# Patient Record
Sex: Female | Born: 1980 | Race: White | Hispanic: No | Marital: Married | Smoking: Never smoker
Health system: Southern US, Community
[De-identification: ages and names within clinical notes are randomized; demographics above are authoritative.]

## PROBLEM LIST (undated history)

## (undated) DIAGNOSIS — N200 Calculus of kidney: Secondary | ICD-10-CM

## (undated) DIAGNOSIS — E538 Deficiency of other specified B group vitamins: Secondary | ICD-10-CM

## (undated) DIAGNOSIS — L309 Dermatitis, unspecified: Secondary | ICD-10-CM

## (undated) HISTORY — PX: WISDOM TOOTH EXTRACTION: SHX21

## (undated) HISTORY — DX: Deficiency of other specified B group vitamins: E53.8

## (undated) HISTORY — DX: Dermatitis, unspecified: L30.9

## (undated) HISTORY — DX: Calculus of kidney: N20.0

---

## 2006-05-11 ENCOUNTER — Emergency Department: Payer: Self-pay | Admitting: Emergency Medicine

## 2008-07-17 ENCOUNTER — Inpatient Hospital Stay: Payer: Self-pay

## 2008-07-31 ENCOUNTER — Ambulatory Visit: Payer: Self-pay | Admitting: Pediatrics

## 2010-10-10 ENCOUNTER — Inpatient Hospital Stay: Payer: Self-pay | Admitting: Obstetrics and Gynecology

## 2013-04-16 ENCOUNTER — Encounter: Payer: Self-pay | Admitting: Maternal & Fetal Medicine

## 2013-04-30 ENCOUNTER — Observation Stay: Payer: Self-pay | Admitting: Obstetrics & Gynecology

## 2013-05-02 ENCOUNTER — Inpatient Hospital Stay: Payer: Self-pay | Admitting: Certified Nurse Midwife

## 2013-05-02 LAB — CBC WITH DIFFERENTIAL/PLATELET
Basophil #: 0.1 10*3/uL (ref 0.0–0.1)
Basophil %: 0.6 %
Eosinophil #: 0.2 10*3/uL (ref 0.0–0.7)
Eosinophil %: 1.4 %
Lymphocyte %: 18.6 %
MCH: 31.9 pg (ref 26.0–34.0)
Monocyte #: 0.7 x10 3/mm (ref 0.2–0.9)
Monocyte %: 6.2 %
RBC: 3.89 10*6/uL (ref 3.80–5.20)
WBC: 12.1 10*3/uL — ABNORMAL HIGH (ref 3.6–11.0)

## 2013-05-03 LAB — HEMATOCRIT: HCT: 31.1 % — ABNORMAL LOW (ref 35.0–47.0)

## 2014-10-08 ENCOUNTER — Ambulatory Visit: Payer: Self-pay | Admitting: Nurse Practitioner

## 2015-01-04 NOTE — H&P (Signed)
L&D Evaluation:  History:  HPI 34 yo G3P0202 previous pre43term deliveries presenting at 2055w2d with SROM at 0300, contractions.  No VB, no LOF   Presents with contractions   Patient's Medical History low blood pressure   Patient's Surgical History wisdom teeth   Medications Pre Natal Vitamins   Allergies NKDA   Social History none  teacher   Family History Non-Contributory   ROS:  ROS All systems were reviewed.  HEENT, CNS, GI, GU, Respiratory, CV, Renal and Musculoskeletal systems were found to be normal.   Exam:  Vital Signs stable   Urine Protein not completed   General no apparent distress   Mental Status clear   Abdomen gravid, non-tender   Estimated Fetal Weight Average for gestational age   Fetal Position vertex   Back no CVAT   Pelvic no external lesions, 6cm   Mebranes Ruptured   Description clear   FHT normal rate with no decels, reactive   Ucx regular   Skin dry   Impression:  Impression Precipitious vaginal delivery   Plan:  Comments admit for postpartum care   Electronic Signatures: Lorrene ReidStaebler, Mukhtar Shams M (MD)  (Signed 06-Sep-14 04:51)  Authored: L&D Evaluation   Last Updated: 06-Sep-14 04:51 by Lorrene ReidStaebler, [place] Severs M (MD)

## 2015-01-04 NOTE — H&P (Signed)
L&D Evaluation:  History Expanded:  HPI [age] yo G3P0202 previous preterm deliveries. Now 40 weeks. Non-Stress Test today.  AFI normal today.   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  HEENT, CNS, GI, GU, Respiratory, CV, Renal and Musculoskeletal systems were found to be normal.   Exam:  Vital Signs stable   Urine Protein not completed   General no apparent distress   Mental Status clear   Abdomen gravid, non-tender   Estimated Fetal Weight Average for gestational age   Fetal Position vertex   Back no CVAT   Pelvic no external lesions, 4cm by Dr Bonney AidStaebler   FHT normal rate with no decels   Fetal Heart Rate 140   Ucx absent   Skin dry   Impression:  Impression Post Dates, Non-Stress Test   Plan:  Plan EFM/NST, antibiotics for GBBS prophylaxis, fluids   Comments Non-Stress Test REACTIVE Fetal Movement Counts discussed.   Follow Up Appointment already scheduled. in 6 weeks   Electronic Signatures: Letitia LibraHarris, Sativa Gelles Paul (MD)  (Signed 04-Sep-14 18:02)  Authored: L&D Evaluation   Last Updated: 04-Sep-14 18:02 by Letitia LibraHarris, Avishai Reihl Paul (MD)

## 2016-10-10 IMAGING — US US GALLBLADDER-BILIARY (RUQ)
1 series · 14 of 25 positions shown · non-contrast
Comparison: None.

CLINICAL DATA: Chronic right upper quadrant abdominal pain

EXAM:
US ABDOMEN LIMITED - RIGHT UPPER QUADRANT

[Series 1: us gallbladder-biliary (ruq) · 0.31mm/px · 14 of 50 slices shown]
[im 1/50]
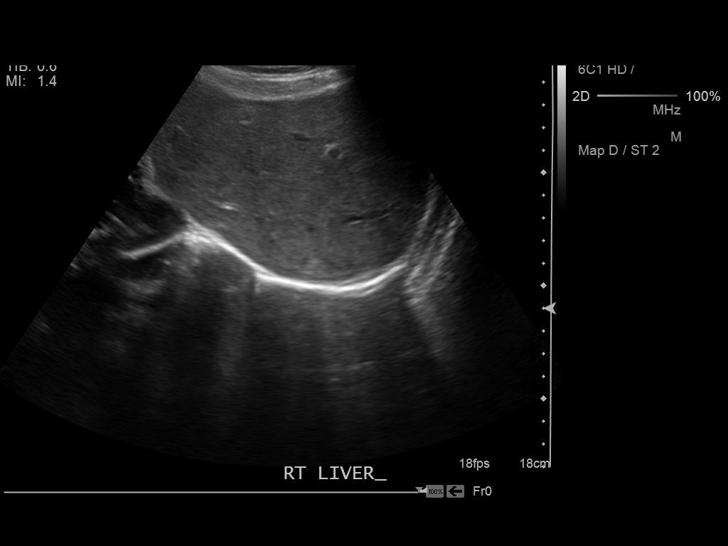
[im 5/50]
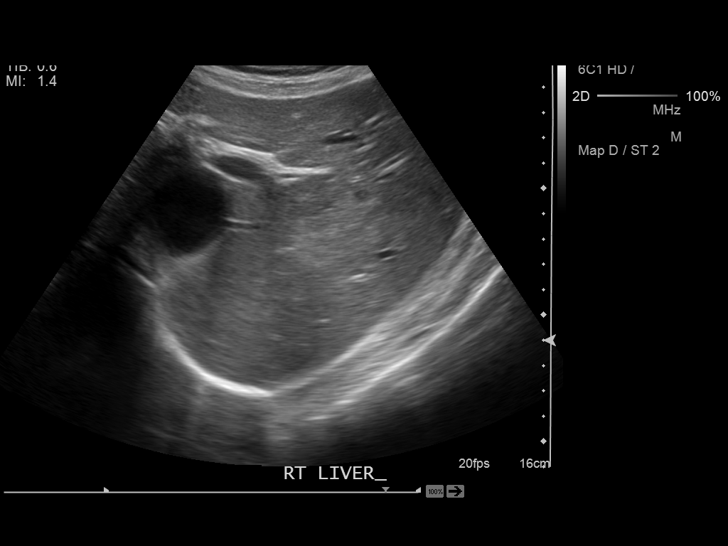
[im 9/50]
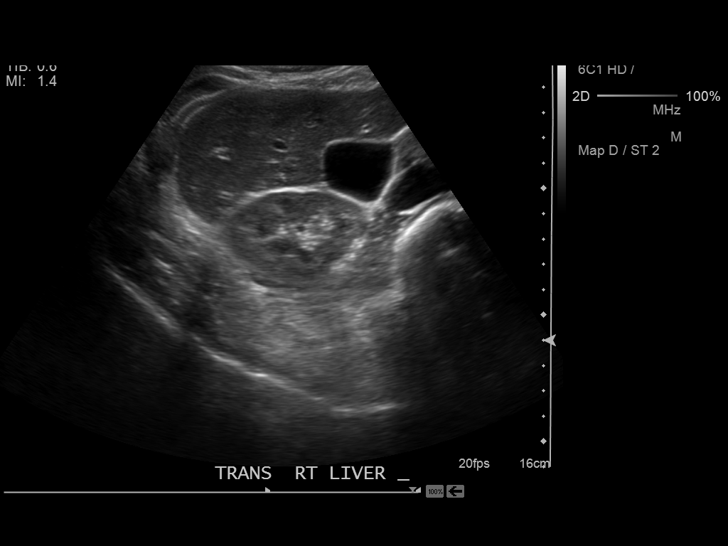
[im 13/50]
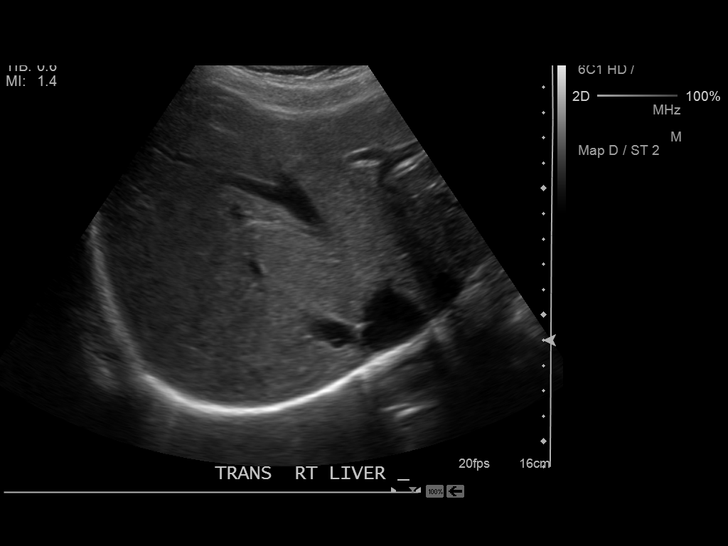
[im 17/50]
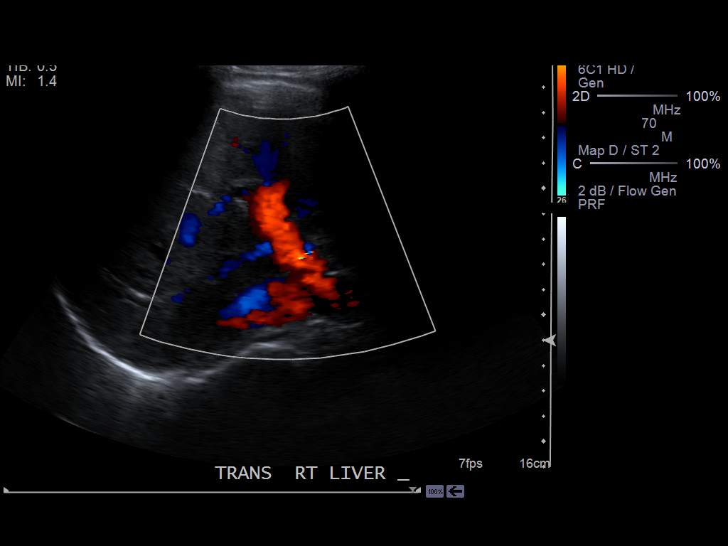
[im 19/50]
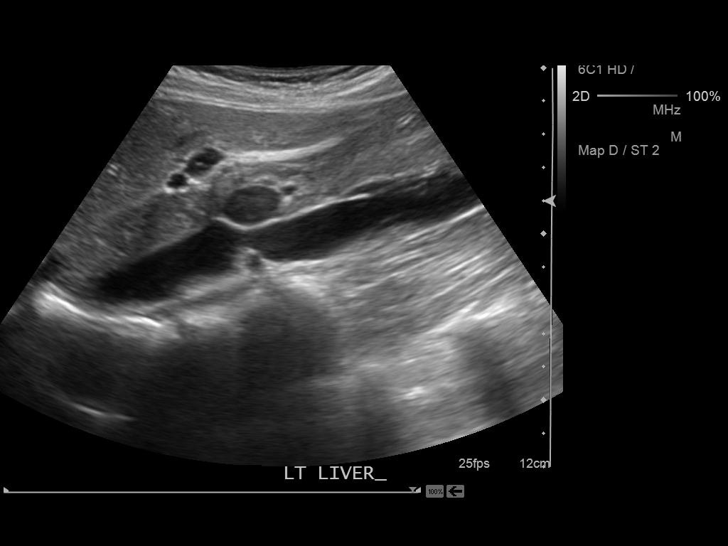
[im 23/50]
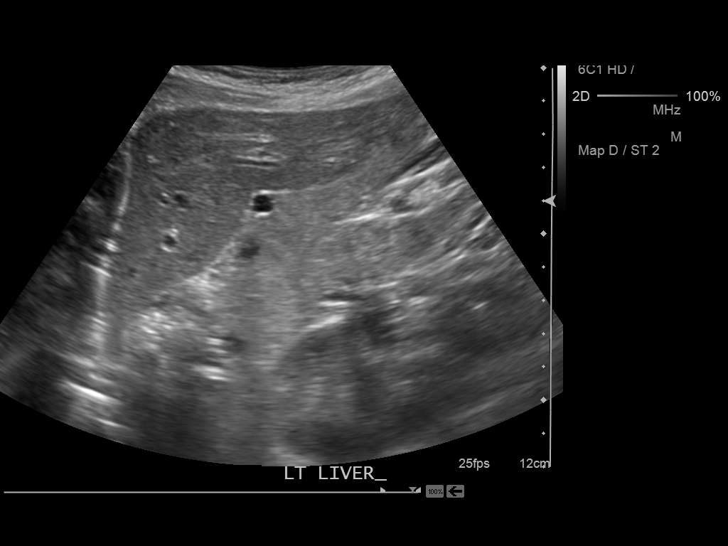
[im 27/50]
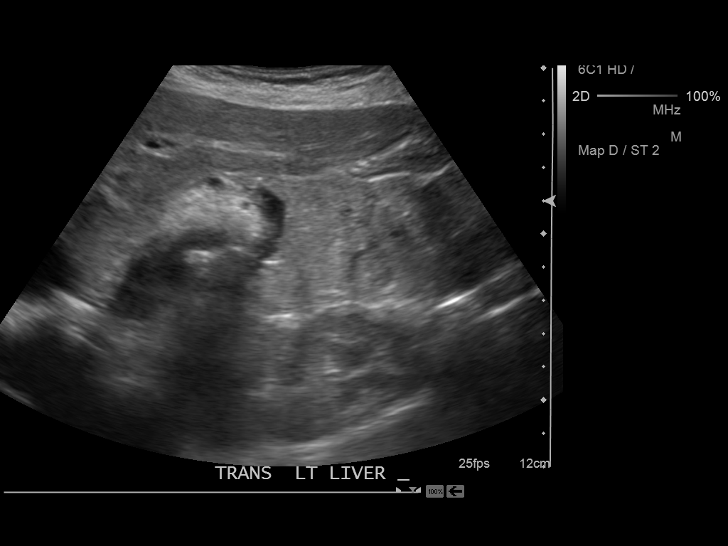
[im 31/50]
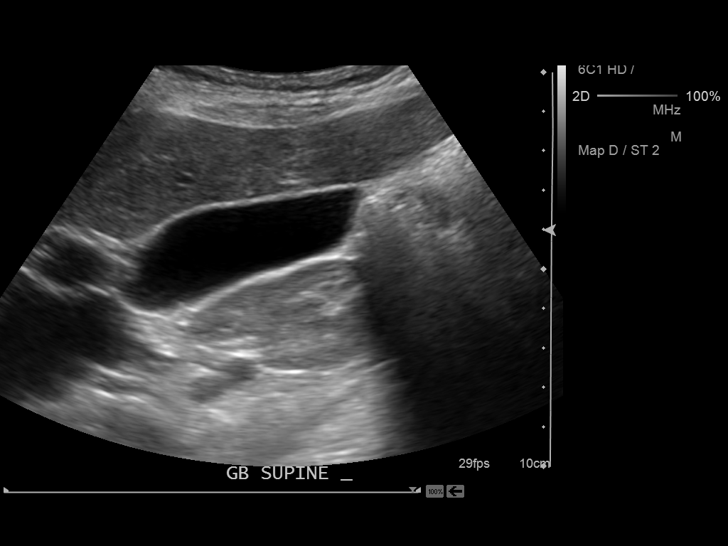
[im 33/50]
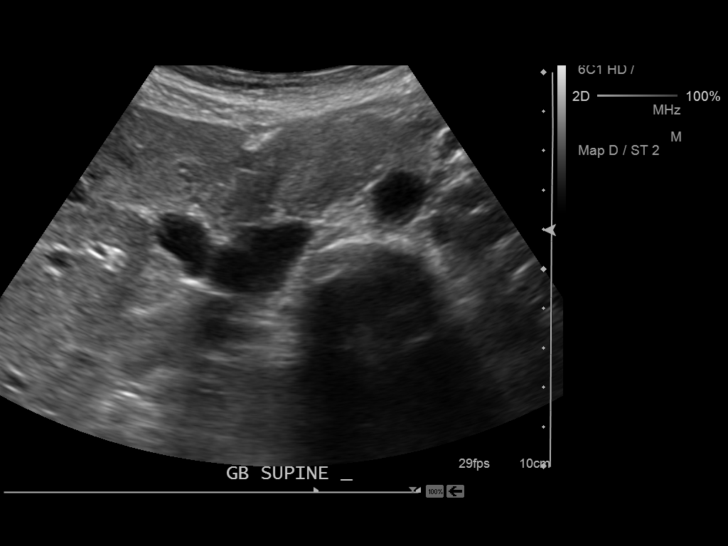
[im 37/50]
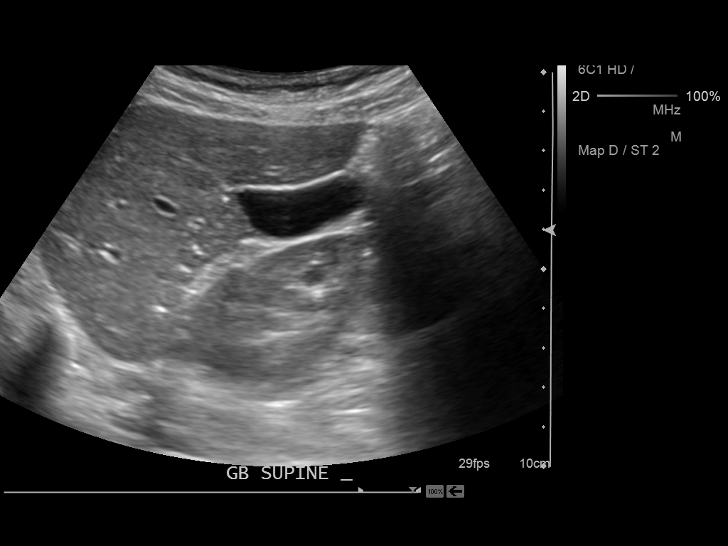
[im 41/50]
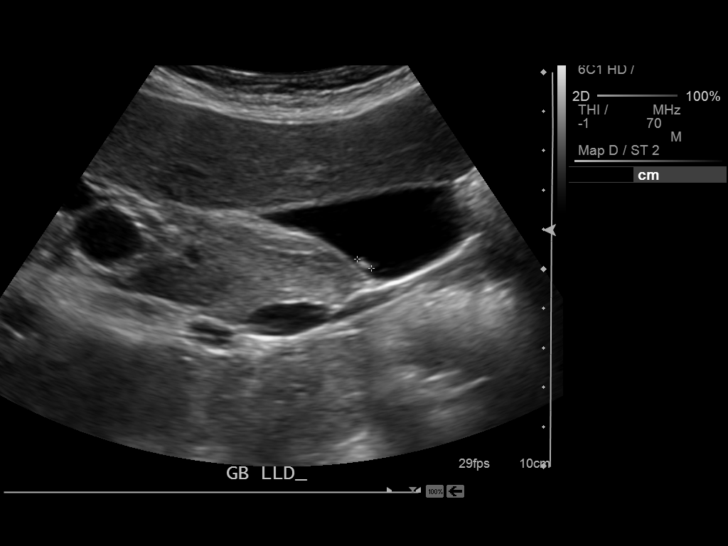
[im 45/50]
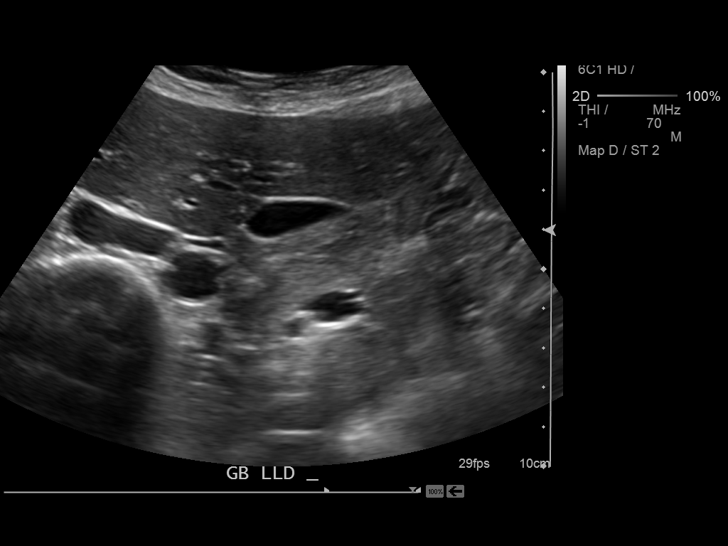
[im 50/50]
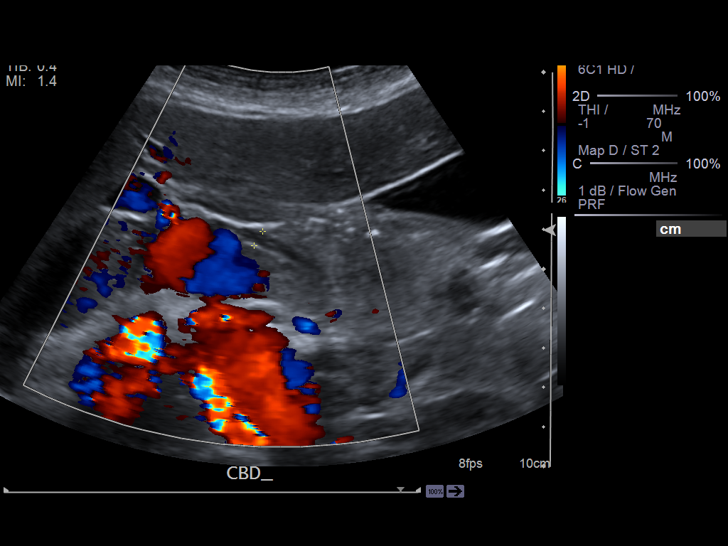

[14 of 25 positions shown; findings below may reference images not displayed]

FINDINGS: Gallbladder:

The gallbladder is adequately distended. There is a non shadowing
non mobile 4 mm diameter echogenic focus within the gallbladder.
There is no gallbladder wall thickening, pericholecystic fluid, or
positive sonographic Murphy's sign.

Common bile duct:

Diameter: 4.2 mm

Liver:

The liver exhibits normal echotexture with no focal mass nor ductal
dilation.
IMPRESSION: There is a 4 mm diameter gallbladder polyp versus adherent stone.
There is no sonographic evidence of acute cholecystitis. The common
bile duct and liver are unremarkable.

If there remains strong clinical concerns of gallbladder
dysfunction, a nuclear medicine hepatobiliary scan with gallbladder
ejection fraction may be useful.

## 2016-11-01 ENCOUNTER — Ambulatory Visit (INDEPENDENT_AMBULATORY_CARE_PROVIDER_SITE_OTHER): Payer: BLUE CROSS/BLUE SHIELD | Admitting: Obstetrics and Gynecology

## 2016-11-01 ENCOUNTER — Encounter: Payer: Self-pay | Admitting: Obstetrics and Gynecology

## 2016-11-01 VITALS — BP 110/66 | HR 75 | Ht 63.0 in | Wt 116.0 lb

## 2016-11-01 DIAGNOSIS — Z124 Encounter for screening for malignant neoplasm of cervix: Secondary | ICD-10-CM

## 2016-11-01 DIAGNOSIS — L308 Other specified dermatitis: Secondary | ICD-10-CM | POA: Diagnosis not present

## 2016-11-01 DIAGNOSIS — Z01419 Encounter for gynecological examination (general) (routine) without abnormal findings: Secondary | ICD-10-CM

## 2016-11-01 NOTE — Progress Notes (Signed)
HPI:      [name] is a 36 y.o. No obstetric history on file. who LMP was Patient's last menstrual period was 10/11/2016 (approximate)., presents today for her annual examination.  Her menses are regular every 28-30 days, lasting 7 day(s).  Dysmenorrhea none. She does not have intermenstrual bleeding.  She is single partner, contraception - vasectomy.  Last Pap: September 30, 2014  Results were: no abnormalities  Hx of STDs: none   There is no FH of breast cancer. There is no FH of ovarian cancer. The patient does do self-breast exams.  Tobacco use: The patient denies current or previous tobacco use. Alcohol use: none Exercise: not active  She does not get adequate calcium and Vitamin D in her diet.  She has a hx of eczema for the past 5 yrs. She has seen derm and allergist without relief. She has been on steroids and that is the only thing that gives relief. She is limited by activities due to severity of sx on her hands (glove like presentation). She has not seen a specialist.   Past Medical History:  Diagnosis Date  . Eczema     No past surgical history on file.  Family History  Problem Relation Age of Onset  . Pancreatic cancer Maternal Grandfather 80     ROS:  Review of Systems  Constitutional: Negative for fever, malaise/fatigue and weight loss.  HENT: Negative for congestion, ear pain and sinus pain.   Respiratory: Negative for cough, shortness of breath and wheezing.   Cardiovascular: Negative for chest pain, orthopnea and leg swelling.  Gastrointestinal: Negative for constipation, diarrhea, nausea and vomiting.  Genitourinary: Negative for dysuria, frequency, hematuria and urgency.       Breast ROS: negative   Musculoskeletal: Negative for back pain, joint pain and myalgias.  Skin: Positive for itching and rash.  Neurological: Negative for dizziness, tingling, focal weakness and headaches.  Endo/Heme/Allergies: Negative for environmental allergies.  Does not bruise/bleed easily.  Psychiatric/Behavioral: Negative for depression and suicidal ideas. The patient is not nervous/anxious and does not have insomnia.     Objective: BP 110/66 (Patient Position: Sitting)   Pulse 75   Ht 5\' 3"  (1.6 m)   Wt 116 lb (52.6 kg)   LMP 10/11/2016 (Approximate)   BMI 20.55 kg/m    Physical Exam  Constitutional: She is oriented to person, place, and time. She appears well-developed and well-nourished.  Genitourinary: Vagina normal and uterus normal. No erythema or tenderness in the vagina. No vaginal discharge found. Right adnexum does not display mass and does not display tenderness. Left adnexum does not display mass and does not display tenderness. Cervix does not exhibit motion tenderness or polyp. Uterus is not enlarged or tender.  Neck: Normal range of motion. No thyromegaly present.  Cardiovascular: Normal rate, regular rhythm and normal heart sounds.   No murmur heard. Pulmonary/Chest: Effort normal and breath sounds normal. Right breast exhibits no mass, no nipple discharge, no skin change and no tenderness. Left breast exhibits no mass, no nipple discharge, no skin change and no tenderness.  Abdominal: Soft. There is no tenderness. There is no guarding.  Musculoskeletal: Normal range of motion.  Neurological: She is alert and oriented to person, place, and time. No cranial nerve deficit.  Skin: Rash noted. There is erythema.  Psychiatric: She has a normal mood and affect. Her behavior is normal.  Vitals reviewed.   Assessment/Plan: Encounter for annual routine gynecological examination  Cervical cancer screening -  Plan: Pap IG and HPV (high risk) DNA detection  Other eczema - Will refer to derm specialist.  - Plan: Ambulatory referral to Dermatology                GYN counsel adequate intake of calcium and vitamin D     F/U  Return in about 1 year (around 11/01/2017).  Wendy Mccoy B. Dakarri Kessinger, PA-C 11/01/2016 3:22 PM

## 2016-11-03 LAB — PAP IG AND HPV HIGH-RISK
HPV, high-risk: NEGATIVE
PAP SMEAR COMMENT: 0

## 2018-07-28 ENCOUNTER — Encounter: Payer: Self-pay | Admitting: Obstetrics and Gynecology

## 2018-07-28 ENCOUNTER — Ambulatory Visit (INDEPENDENT_AMBULATORY_CARE_PROVIDER_SITE_OTHER): Payer: BLUE CROSS/BLUE SHIELD | Admitting: Obstetrics and Gynecology

## 2018-07-28 VITALS — BP 108/66 | HR 91 | Ht 63.0 in | Wt 123.0 lb

## 2018-07-28 DIAGNOSIS — K5901 Slow transit constipation: Secondary | ICD-10-CM | POA: Insufficient documentation

## 2018-07-28 DIAGNOSIS — Z01419 Encounter for gynecological examination (general) (routine) without abnormal findings: Secondary | ICD-10-CM

## 2018-07-28 NOTE — Progress Notes (Signed)
HPI:      [name] is a 37 y.o. No obstetric history on file. who LMP was Patient's last menstrual period was 06/27/2018 (approximate)., presents today for her annual examination.  Her menses are regular every 28-30 days, lasting 5-7 day(s).  Dysmenorrhea none. She does not have intermenstrual bleeding.  She is single partner, contraception - vasectomy.  Last Pap: 11/01/16  Results were: no abnormalities /neg HPV DNA Hx of STDs: none  Mammogram: N/A There is no FH of breast cancer. There is no FH of ovarian cancer. The patient does do self-breast exams.  Tobacco use: The patient denies current or previous tobacco use. Alcohol use: none Exercise: not active  She does not get adequate calcium but does get Vitamin D in her diet.  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 Sx improved with diet/allergy avoidance.   Pt with random, occas diffuse abd pains. Hx of constipation and may be related to sx. Pt states she has hernia that "moves" although no bulge noted abd.  Past Medical History:  Diagnosis Date  . 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 Eczema     Past Surgical History:  Procedure Laterality Date  . WISDOM TOOTH EXTRACTION      Family History  Problem Relation Age of Onset  . Pancreatic cancer Maternal Grandfather 80  . Diabetes Maternal Grandmother        Type 2  . Diabetes Paternal Grandfather        Type 2     ROS:  Review of Systems  Constitutional: Negative for fever, malaise/fatigue and weight loss.  HENT: Negative for congestion, ear pain and sinus pain.   Respiratory: Negative for cough, shortness of breath and wheezing.   Cardiovascular: Negative for chest pain, orthopnea and leg swelling.  Gastrointestinal: Positive for constipation. Negative for diarrhea, nausea and vomiting.  Genitourinary: Negative for dysuria, frequency, hematuria and urgency.       Breast ROS:  negative   Musculoskeletal: Negative for back pain, joint pain and myalgias.  Skin: Negative for itching and rash.  Neurological: Negative for dizziness, tingling, focal weakness and headaches.  Endo/Heme/Allergies: Negative for environmental allergies. Does not bruise/bleed easily.  Psychiatric/Behavioral: Negative for depression and suicidal ideas. The patient is not nervous/anxious and does not have insomnia.     Objective: BP 108/66   Pulse 91   Ht 5\' 3"  (1.6 m)   Wt 123 lb (55.8 kg)   LMP 06/27/2018 (Approximate)   BMI 21.79 kg/m    Physical Exam  Constitutional: She is oriented to person, place, and time. She appears well-developed and well-nourished.  Genitourinary: Vagina normal and uterus normal. No erythema or tenderness in the vagina. No vaginal discharge found. Right adnexum does not display mass and does not display tenderness. Left adnexum does not display mass and does not display tenderness. Cervix does not exhibit motion tenderness or polyp. Uterus is not enlarged or tender.  Neck: Normal range of motion. No thyromegaly present.  Cardiovascular: Normal rate, regular rhythm and normal heart sounds.  No murmur heard. Pulmonary/Chest: Effort normal and breath sounds normal. Right breast exhibits no mass, no nipple discharge, no skin change and no tenderness. Left breast exhibits no mass, no nipple discharge, no skin change and no tenderness.  Abdominal: Soft. There is no tenderness. There is no guarding.  Musculoskeletal: Normal range of motion.  Neurological: She is alert and oriented to person, place, and time. No cranial nerve deficit.  Skin: No rash noted. No erythema.  Psychiatric: She has a normal mood and affect. Her behavior is normal.  Vitals reviewed.   Assessment/Plan: Encounter for annual routine gynecological examination  Slow transit constipation - Add benefiber/lots of water. Add exercise. Most likely trigger of occas pelvic pains.            GYN  counsel adequate intake of calcium and vitamin D     F/U  Return in about 1 year (around 07/29/2019).  Wendy Mccoy B. Wendy Spaid, PA-C 07/28/2018 12:08 PM

## 2018-07-28 NOTE — Patient Instructions (Signed)
I value your feedback and entrusting us with your care. If you get a [facility] patient survey, I would appreciate you taking the time to let us know about your experience today. Thank you! 

## 2019-11-16 ENCOUNTER — Other Ambulatory Visit (HOSPITAL_COMMUNITY)
Admission: RE | Admit: 2019-11-16 | Discharge: 2019-11-16 | Disposition: A | Discharge status: Home / Self Care | Payer: 59 | Source: Ambulatory Visit | Attending: Obstetrics and Gynecology | Admitting: Obstetrics and Gynecology

## 2019-11-16 ENCOUNTER — Encounter: Payer: Self-pay | Admitting: Obstetrics and Gynecology

## 2019-11-16 ENCOUNTER — Ambulatory Visit (INDEPENDENT_AMBULATORY_CARE_PROVIDER_SITE_OTHER): Payer: 59 | Admitting: Obstetrics and Gynecology

## 2019-11-16 ENCOUNTER — Other Ambulatory Visit: Payer: Self-pay

## 2019-11-16 VITALS — BP 110/84 | Ht 63.0 in | Wt 127.0 lb

## 2019-11-16 DIAGNOSIS — Z01419 Encounter for gynecological examination (general) (routine) without abnormal findings: Secondary | ICD-10-CM

## 2019-11-16 DIAGNOSIS — R1011 Right upper quadrant pain: Secondary | ICD-10-CM | POA: Diagnosis not present

## 2019-11-16 DIAGNOSIS — Z124 Encounter for screening for malignant neoplasm of cervix: Secondary | ICD-10-CM

## 2019-11-16 DIAGNOSIS — Z1151 Encounter for screening for human papillomavirus (HPV): Secondary | ICD-10-CM

## 2019-11-16 DIAGNOSIS — Z803 Family history of malignant neoplasm of breast: Secondary | ICD-10-CM | POA: Diagnosis not present

## 2019-11-16 LAB — HM PAP SMEAR: HM Pap smear: NORMAL

## 2019-11-16 NOTE — Progress Notes (Signed)
HPI:      [name] is a 39 y.o. No obstetric history on file. who LMP was Patient's last menstrual period was 11/11/2019 (exact date)., presents today for her annual examination.  Her menses are regular every 28-30 days, lasting 5-7 day(s).  Dysmenorrhea none. She does not have intermenstrual bleeding.  She is single partner, contraception - vasectomy.  Last Pap: 11/01/16  Results were: no abnormalities /neg HPV DNA Hx of STDs: none  There is a new FH of breast cancer in her mom, genetic testing not indicated for pt. There is no FH of ovarian cancer. The patient does do self-breast exams.  Tobacco use: The patient denies current or previous tobacco use. Alcohol use: none  No drug use Exercise: min active  She does not get adequate calcium but does get Vitamin D in her diet.  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 Sx improved with diet/allergy avoidance.   Pt with random, occas diffuse RT sided abd pains. Hx of constipation and may be related to sx. Pt states she has hernia that "moves" although no bulge noted abd (happened after last pregnancy). Also occas has muscle spasm RT upper abd area near ribs, occurs in certain positions.  Past Medical History:  Diagnosis Date  . 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 Eczema     Past Surgical History:  Procedure Laterality Date  . WISDOM TOOTH EXTRACTION      Family History  Problem Relation Age of Onset  . Pancreatic cancer Maternal Grandfather 80  . Diabetes Maternal Grandmother        Type 2  . Diabetes Paternal Grandfather        Type 2  . Breast cancer Mother 16     ROS:  Review of Systems  Constitutional: Negative for fever, malaise/fatigue and weight loss.  HENT: Negative for congestion, ear pain and sinus pain.   Respiratory: Negative for cough, shortness of breath and wheezing.   Cardiovascular: Negative for chest pain, orthopnea and leg  swelling.  Gastrointestinal: Positive for constipation. Negative for diarrhea, nausea and vomiting.  Genitourinary: Negative for dysuria, frequency, hematuria and urgency.       Breast ROS: negative   Musculoskeletal: Positive for myalgias. Negative for back pain and joint pain.  Skin: Negative for itching and rash.  Neurological: Negative for dizziness, tingling, focal weakness and headaches.  Endo/Heme/Allergies: Negative for environmental allergies. Does not bruise/bleed easily.  Psychiatric/Behavioral: Negative for depression and suicidal ideas. The patient is not nervous/anxious and does not have insomnia.     Objective: BP 110/84   Ht 5\' 3"  (1.6 m)   Wt 127 lb (57.6 kg)   LMP 11/11/2019 (Exact Date)   BMI 22.50 kg/m    Physical Exam Constitutional:      Appearance: She is well-developed.  Genitourinary:     Vulva, vagina, uterus, right adnexa and left adnexa normal.     No vulval lesion or tenderness noted.     No vaginal discharge, erythema or tenderness.     No cervical motion tenderness or polyp.     Uterus is not enlarged or tender.     No right or left adnexal mass present.     Right adnexa not tender.     Left adnexa not tender.  Neck:     Thyroid: No thyromegaly.  Cardiovascular:  Rate and Rhythm: Normal rate and regular rhythm.     Heart sounds: Normal heart sounds. No murmur.  Pulmonary:     Effort: Pulmonary effort is normal.     Breath sounds: Normal breath sounds.  Chest:     Breasts:        Right: No mass, nipple discharge, skin change or tenderness.        Left: No mass, nipple discharge, skin change or tenderness.  Abdominal:     Palpations: Abdomen is soft.     Tenderness: There is no abdominal tenderness. There is no guarding or rebound.     Comments: NO EVIDENCE OF HERNIA; SX SOUND MSK  Musculoskeletal:        General: Normal range of motion.     Cervical back: Normal range of motion.  Neurological:     General: No focal deficit present.      Mental Status: She is alert and oriented to person, place, and time.     Cranial Nerves: No cranial nerve deficit.  Skin:    General: Skin is warm and dry.     Findings: No erythema or rash.  Psychiatric:        Mood and Affect: Mood normal.        Behavior: Behavior normal.        Thought Content: Thought content normal.        Judgment: Judgment normal.  Vitals reviewed.     Assessment/Plan: Encounter for annual routine gynecological examination  Cervical cancer screening - Plan: Cytology - PAP  Screening for HPV (human papillomavirus) - Plan: Cytology - PAP  Right upper quadrant abdominal pain--sounds MSK. Add ab/core exercises. Reassurance. No evid of hernia. Can check abd and pelvic u/s prn sx.            GYN counsel adequate intake of calcium and vitamin D     F/U  Return in about 1 year (around 11/15/2020).  Alicia B. Copland, PA-C 11/16/2019 4:58 PM

## 2019-11-16 NOTE — Patient Instructions (Signed)
I value your feedback and entrusting us with your care. If you get a [facility] patient survey, I would appreciate you taking the time to let us know about your experience today. Thank you!  As of August 06, 2019, your lab results will be released to your MyChart immediately, before I even have a chance to see them. Please give me time to review them and contact you if there are any abnormalities. Thank you for your patience.  

## 2019-11-18 LAB — CYTOLOGY - PAP
Comment: NEGATIVE
Diagnosis: NEGATIVE
High risk HPV: NEGATIVE

## 2020-11-17 ENCOUNTER — Ambulatory Visit (INDEPENDENT_AMBULATORY_CARE_PROVIDER_SITE_OTHER): Payer: No Typology Code available for payment source | Admitting: Obstetrics and Gynecology

## 2020-11-17 ENCOUNTER — Encounter: Payer: Self-pay | Admitting: Obstetrics and Gynecology

## 2020-11-17 ENCOUNTER — Other Ambulatory Visit: Payer: Self-pay

## 2020-11-17 VITALS — BP 122/74 | Ht 63.0 in | Wt 137.0 lb

## 2020-11-17 DIAGNOSIS — Z01419 Encounter for gynecological examination (general) (routine) without abnormal findings: Secondary | ICD-10-CM | POA: Diagnosis not present

## 2020-11-17 DIAGNOSIS — Z803 Family history of malignant neoplasm of breast: Secondary | ICD-10-CM

## 2020-11-17 NOTE — Patient Instructions (Signed)
I value your feedback and you entrusting us with your care. If you get a [facility] patient survey, I would appreciate you taking the time to let us know about your experience today. Thank you! ? ? ?

## 2020-11-17 NOTE — Progress Notes (Signed)
Chief Complaint  Patient presents with  . Annual Exam    HPI:      [name] is a 40 y.o. No obstetric history on file. who LMP was Patient's last menstrual period was 11/05/2020., presents today for her annual examination.  Her menses are regular every 28-30 days, lasting 5-7 day(s).  Dysmenorrhea none. She rarely has light intermenstrual bleeding.  She is single partner, contraception - vasectomy. No pain/bleeding.  Last Pap: 11/16/19  Results were: no abnormalities /neg HPV DNA Hx of STDs: none  There is a new FH of breast cancer in her mom, genetic testing not indicated for pt. There is no FH of ovarian cancer. The patient does do self-breast exams.  Tobacco use: The patient denies current or previous tobacco use. Alcohol use: none  No drug use Exercise: min active  She does get adequate calcium and Vitamin D in her diet.  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 Sx improved with diet/allergy avoidance.    Past Medical History:  Diagnosis Date  . 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 Eczema     Past Surgical History:  Procedure Laterality Date  . WISDOM TOOTH EXTRACTION      Family History  Problem Relation Age of Onset  . Diabetes Maternal Grandmother        Type 2  . Diabetes Paternal Grandfather        Type 2  . Pancreatic cancer Paternal Grandfather 109  . Breast cancer Mother 12     ROS:  Review of Systems  Constitutional: Negative for fever, malaise/fatigue and weight loss.  HENT: Negative for congestion, ear pain and sinus pain.   Respiratory: Negative for cough, shortness of breath and wheezing.   Cardiovascular: Negative for chest pain, orthopnea and leg swelling.  Gastrointestinal: Negative for constipation, diarrhea, nausea and vomiting.  Genitourinary: Negative for dysuria, frequency, hematuria and urgency.       Breast ROS: negative   Musculoskeletal: Negative for  back pain, joint pain and myalgias.  Skin: Negative for itching and rash.  Neurological: Negative for dizziness, tingling, focal weakness and headaches.  Endo/Heme/Allergies: Negative for environmental allergies. Does not bruise/bleed easily.  Psychiatric/Behavioral: Negative for depression and suicidal ideas. The patient is not nervous/anxious and does not have insomnia.     Objective: BP 122/74   Ht 5\' 3"  (1.6 m)   Wt 137 lb (62.1 kg)   LMP 11/05/2020   BMI 24.27 kg/m    Physical Exam Constitutional:      Appearance: She is well-developed.  Genitourinary:     Vulva normal.     Right Labia: No rash, tenderness or lesions.    Left Labia: No tenderness, lesions or rash.    No vaginal discharge, erythema or tenderness.      Right Adnexa: not tender and no mass present.    Left Adnexa: not tender and no mass present.    No cervical motion tenderness, friability or polyp.     Uterus is not enlarged or tender.  Breasts:     Right: No mass, nipple discharge, skin change or tenderness.     Left: No mass, nipple discharge, skin change or tenderness.    Neck:     Thyroid: No thyromegaly.  Cardiovascular:     Rate and Rhythm: Normal rate and regular rhythm.     Heart sounds: Normal heart  sounds. No murmur heard.   Pulmonary:     Effort: Pulmonary effort is normal.     Breath sounds: Normal breath sounds.  Abdominal:     Palpations: Abdomen is soft.     Tenderness: There is no abdominal tenderness. There is no guarding or rebound.     Comments: NO EVIDENCE OF HERNIA; SX SOUND MSK  Musculoskeletal:        General: Normal range of motion.     Cervical back: Normal range of motion.  Lymphadenopathy:     Cervical: No cervical adenopathy.  Neurological:     General: No focal deficit present.     Mental Status: She is alert and oriented to person, place, and time.     Cranial Nerves: No cranial nerve deficit.  Skin:    General: Skin is warm and dry.     Findings: No erythema  or rash.  Psychiatric:        Mood and Affect: Mood normal.        Behavior: Behavior normal.        Thought Content: Thought content normal.        Judgment: Judgment normal.  Vitals reviewed.     Assessment/Plan: Encounter for annual routine gynecological examination  Family history of breast cancer--genetic testing not indicated. Start mammos age 60            GYN counsel adequate intake of calcium and vitamin D     F/U  Return in about 1 year (around 11/17/2021).  Jamontae Thwaites B. Bernisha Verma, PA-C 11/17/2020 8:50 AM

## 2022-01-11 ENCOUNTER — Ambulatory Visit (INDEPENDENT_AMBULATORY_CARE_PROVIDER_SITE_OTHER): Payer: No Typology Code available for payment source | Admitting: Obstetrics and Gynecology

## 2022-01-11 ENCOUNTER — Encounter: Payer: Self-pay | Admitting: Obstetrics and Gynecology

## 2022-01-11 VITALS — BP 122/62 | Ht 63.0 in | Wt 131.0 lb

## 2022-01-11 DIAGNOSIS — Z01419 Encounter for gynecological examination (general) (routine) without abnormal findings: Secondary | ICD-10-CM

## 2022-01-11 DIAGNOSIS — R109 Unspecified abdominal pain: Secondary | ICD-10-CM | POA: Diagnosis not present

## 2022-01-11 DIAGNOSIS — R10A1 Flank pain, right side: Secondary | ICD-10-CM

## 2022-01-11 DIAGNOSIS — Z1231 Encounter for screening mammogram for malignant neoplasm of breast: Secondary | ICD-10-CM | POA: Diagnosis not present

## 2022-01-11 DIAGNOSIS — R3915 Urgency of urination: Secondary | ICD-10-CM

## 2022-01-11 LAB — POCT URINALYSIS DIPSTICK
Bilirubin, UA: NEGATIVE
Glucose, UA: NEGATIVE
Ketones, UA: NEGATIVE
Leukocytes, UA: NEGATIVE
Nitrite, UA: NEGATIVE
Protein, UA: NEGATIVE
Spec Grav, UA: 1.015 (ref 1.010–1.025)
pH, UA: 6 (ref 5.0–8.0)

## 2022-01-11 MED ORDER — URIBEL 118 MG PO CAPS: 118.0000 mg | ORAL_CAPSULE | Freq: Four times a day (QID) | ORAL | 0 refills | Status: DC | PRN

## 2022-01-11 NOTE — Patient Instructions (Signed)
I value your feedback and you entrusting us with your care. If you get a [facility] patient survey, I would appreciate you taking the time to let us know about your experience today. Thank you!  [facility]: [phone]  [facility]: [phone]    

## 2022-01-11 NOTE — Progress Notes (Signed)
Chief Complaint  Patient presents with   Gynecologic Exam    HPI:      [name] is a 41 y.o. No obstetric history on file. who LMP was Patient's last menstrual period was 12/14/2021 (approximate)., presents today for her annual examination.  Her menses are regular every 28-30 days, lasting 4-7 day(s), sometimes with a couple extra days of spotting at end of period.  Dysmenorrhea none. She rarely has light intermenstrual bleeding.  She is single partner, contraception - vasectomy. No pain/bleeding.  Last Pap: 11/16/19  Results were: no abnormalities /neg HPV DNA Hx of STDs: none  Mammogram: never There is a new FH of breast cancer in her mom, genetic testing not indicated for pt. There is no FH of ovarian cancer. The patient does do self-breast exams.  Tobacco use: The patient denies current or previous tobacco use. Alcohol use: none  No drug use Exercise: mod active  She does get adequate calcium and Vitamin D in her diet.  Pt with episodes of RT flank pain/RT kidney pain since 2/23; pain is severe and lasts a few hrs. Pt also has severe urinary urgency with the pain; has to take warm bath to relieve sx.  Does drink tea/caffeine but trying to cut back, now drinking more water. Had labs and xray with PCP; neg for UTI but has had blood in urine on office UA; pt denies any hematuria that she can see.  I can't see xray results in Epic but pt states they were inconclusive. No hx of kidney stones in past.    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  HENT:  Negative for congestion, ear pain and sinus pain.   Respiratory:  Negative for cough, shortness of breath and wheezing.   Cardiovascular:  Negative for chest pain, orthopnea and leg swelling.  Gastrointestinal:  Negative for constipation, diarrhea, nausea and vomiting.  Genitourinary:  Positive for urgency. Negative for dysuria, frequency and hematuria.       Breast ROS: negative   Musculoskeletal:  Negative for back pain, joint pain and myalgias.  Skin:  Negative for itching and rash.  Neurological:  Negative for dizziness, tingling, focal weakness and headaches.  Endo/Heme/Allergies:  Negative for environmental allergies. Does not bruise/bleed easily.  Psychiatric/Behavioral:  Negative for depression and suicidal ideas. The patient is not nervous/anxious and does not have insomnia.    Objective: BP 122/62   Ht 5\' 3"  (1.6 m)   Wt 131 lb (59.4 kg)   LMP 12/14/2021 (Approximate)   BMI 23.21 kg/m    Physical Exam Constitutional:      Appearance: She is well-developed.  Genitourinary:     Vulva normal.     Right Labia: No rash, tenderness or lesions.    Left Labia: No tenderness, lesions or rash.    No vaginal discharge, erythema or tenderness.  Right Adnexa: not tender and no mass present.    Left Adnexa: not tender and no mass present.    No cervical motion tenderness, friability or polyp.     Uterus is not enlarged or tender.  Breasts:    Right: No mass, nipple discharge, skin change or tenderness.     Left: No mass, nipple discharge, skin change or tenderness.  Neck:     Thyroid: No thyromegaly.  Cardiovascular:     Rate and Rhythm: Normal rate and regular rhythm.     Heart sounds: Normal heart sounds. No murmur heard. Pulmonary:     Effort: Pulmonary effort is normal.     Breath sounds: Normal breath sounds.  Abdominal:     Palpations: Abdomen is soft.     Tenderness: There is no abdominal tenderness. There is no right  CVA tenderness, left CVA tenderness, guarding or rebound.  Musculoskeletal:        General: Normal range of motion.     Cervical back: Normal range of motion.  Lymphadenopathy:     Cervical: No cervical adenopathy.  Neurological:     General: No focal deficit present.     Mental Status: She is alert and oriented to person, place, and time.     Cranial Nerves: No cranial nerve deficit.  Skin:    General: Skin is warm and dry.     Findings: No erythema or rash.  Psychiatric:        Mood and Affect: Mood normal.        Behavior: Behavior normal.        Thought Content: Thought content normal.        Judgment: Judgment normal.  Vitals reviewed.   Results for orders placed or performed in visit on 01/11/22 (from the past 24 hour(s))  POCT Urinalysis Dipstick     Status: Normal   Collection Time: 01/11/22 12:15 PM  Result Value Ref Range   Color, UA yellow    Clarity, UA clear    Glucose, UA Negative Negative   Bilirubin, UA neg    Ketones, UA neg    Spec Grav, UA 1.015 1.010 - 1.025   Blood, UA trace    pH, UA 6.0 5.0 - 8.0   Protein, UA Negative Negative   Urobilinogen, UA     Nitrite, UA neg    Leukocytes, UA Negative Negative   Appearance     Odor      Assessment/Plan: Encounter for annual routine gynecological examination  Encounter for screening mammogram for malignant neoplasm of breast - Plan: MM 3D SCREEN BREAST BILATERAL; pt to schedule annual  Right flank pain - Plan: CT RENAL STONE STUDY, POCT Urinalysis Dipstick, Comprehensive metabolic panel; trace blood on UA; check CMP and renal CT scan. Will f/u with results.   Urinary urgency - Plan: Meth-Hyo-M Bl-Na Phos-Ph Sal (URIBEL) 118 MG CAPS; Rx uribel prn urgency sx with pain. D/C all caffeine.   Meds ordered this encounter  Medications   Meth-Hyo-M Bl-Na Phos-Ph Sal (URIBEL) 118 MG CAPS    Sig: Take 1 capsule (118 mg total) by mouth 4 (four) times daily as needed.    Dispense:  30 capsule    Refill:  0     Order Specific Question:   Supervising Provider    Answer:   CONSTANT, PEGGY [[id number]]             GYN counsel adequate intake of calcium and vitamin D     F/U  Return in about 1 year (around 01/12/2023).  Ilma Achee B. Mozetta Murfin, PA-C 01/11/2022 12:15 PM

## 2022-01-12 LAB — COMPREHENSIVE METABOLIC PANEL
ALT: 16 IU/L (ref 0–32)
AST: 19 IU/L (ref 0–40)
Albumin/Globulin Ratio: 1.9 (ref 1.2–2.2)
Albumin: 4.7 g/dL (ref 3.8–4.8)
Alkaline Phosphatase: 63 IU/L (ref 44–121)
BUN/Creatinine Ratio: 14 (ref 9–23)
BUN: 8 mg/dL (ref 6–24)
Bilirubin Total: 0.7 mg/dL (ref 0.0–1.2)
CO2: 20 mmol/L (ref 20–29)
Calcium: 9 mg/dL (ref 8.7–10.2)
Chloride: 102 mmol/L (ref 96–106)
Creatinine, Ser: 0.57 mg/dL (ref 0.57–1.00)
Globulin, Total: 2.5 g/dL (ref 1.5–4.5)
Glucose: 87 mg/dL (ref 70–99)
Potassium: 4.1 mmol/L (ref 3.5–5.2)
Sodium: 139 mmol/L (ref 134–144)
Total Protein: 7.2 g/dL (ref 6.0–8.5)
eGFR: 118 mL/min/{1.73_m2} (ref >59)

## 2022-01-31 ENCOUNTER — Ambulatory Visit
Admission: RE | Admit: 2022-01-31 | Discharge: 2022-01-31 | Disposition: A | Discharge status: Home / Self Care | Payer: No Typology Code available for payment source | Source: Ambulatory Visit | Attending: Obstetrics and Gynecology | Admitting: Obstetrics and Gynecology

## 2022-01-31 DIAGNOSIS — R109 Unspecified abdominal pain: Secondary | ICD-10-CM | POA: Diagnosis present

## 2022-02-19 ENCOUNTER — Ambulatory Visit
Admission: RE | Admit: 2022-02-19 | Discharge: 2022-02-19 | Disposition: A | Discharge status: Home / Self Care | Payer: No Typology Code available for payment source | Source: Ambulatory Visit | Attending: Obstetrics and Gynecology | Admitting: Obstetrics and Gynecology

## 2022-02-19 DIAGNOSIS — Z1231 Encounter for screening mammogram for malignant neoplasm of breast: Secondary | ICD-10-CM | POA: Insufficient documentation

## 2022-02-20 ENCOUNTER — Other Ambulatory Visit: Payer: Self-pay | Admitting: Obstetrics and Gynecology

## 2022-02-20 DIAGNOSIS — R928 Other abnormal and inconclusive findings on diagnostic imaging of breast: Secondary | ICD-10-CM

## 2022-02-20 DIAGNOSIS — R921 Mammographic calcification found on diagnostic imaging of breast: Secondary | ICD-10-CM

## 2022-02-26 ENCOUNTER — Ambulatory Visit
Admission: RE | Admit: 2022-02-26 | Discharge: 2022-02-26 | Disposition: A | Discharge status: Home / Self Care | Payer: No Typology Code available for payment source | Source: Ambulatory Visit | Attending: Obstetrics and Gynecology | Admitting: Obstetrics and Gynecology

## 2022-02-26 ENCOUNTER — Other Ambulatory Visit: Payer: Self-pay | Admitting: Obstetrics and Gynecology

## 2022-02-26 DIAGNOSIS — R921 Mammographic calcification found on diagnostic imaging of breast: Secondary | ICD-10-CM | POA: Insufficient documentation

## 2022-02-26 DIAGNOSIS — R928 Other abnormal and inconclusive findings on diagnostic imaging of breast: Secondary | ICD-10-CM | POA: Insufficient documentation

## 2022-03-21 ENCOUNTER — Ambulatory Visit
Admission: RE | Admit: 2022-03-21 | Discharge: 2022-03-21 | Disposition: A | Discharge status: Home / Self Care | Payer: No Typology Code available for payment source | Source: Ambulatory Visit | Attending: Obstetrics and Gynecology | Admitting: Obstetrics and Gynecology

## 2022-03-21 DIAGNOSIS — R928 Other abnormal and inconclusive findings on diagnostic imaging of breast: Secondary | ICD-10-CM | POA: Diagnosis present

## 2022-03-21 DIAGNOSIS — R921 Mammographic calcification found on diagnostic imaging of breast: Secondary | ICD-10-CM | POA: Insufficient documentation

## 2022-03-21 HISTORY — PX: BREAST BIOPSY: SHX20

## 2022-03-22 LAB — SURGICAL PATHOLOGY

## 2023-02-17 NOTE — Progress Notes (Unsigned)
No chief complaint on file.   HPI:      [name] is a 42 y.o. No obstetric history on file. who LMP was No LMP recorded., presents today for her annual examination.  Her menses are regular every 28-30 days, lasting 4-7 day(s), sometimes with a couple extra days of spotting at end of period.  Dysmenorrhea none. She rarely has light intermenstrual bleeding.  She is single partner, contraception - vasectomy. No pain/bleeding.  Last Pap: 11/16/19  Results were: no abnormalities /neg HPV DNA Hx of STDs: none  Mammogram: 02/26/22  Results were cat 4 LT breast with neg bx and PASH on bx; ???? There is a new FH of breast cancer in her mom, genetic testing not indicated for pt. There is no FH of ovarian cancer. The patient does do self-breast exams.  Tobacco use: The patient denies current or previous tobacco use. Alcohol use: none  No drug use Exercise: mod active  She does get adequate calcium and Vitamin D in her diet.  Pt with episodes of RT flank pain/RT kidney pain since 2/23; pain is severe and lasts a few hrs. Pt also has severe urinary urgency with the pain; has to take warm bath to relieve sx.  Does drink tea/caffeine but trying to cut back, now drinking more water. Had labs and xray with PCP; neg for UTI but has had blood in urine on office UA; pt denies any hematuria that she can see.  I can't see xray results in Epic but pt states they were inconclusive. No hx of kidney stones in past.    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  HENT:  Negative for congestion, ear pain and sinus pain.   Respiratory:  Negative for cough, shortness of breath and wheezing.   Cardiovascular:  Negative for chest pain, orthopnea and leg swelling.  Gastrointestinal:  Negative for constipation, diarrhea, nausea and vomiting.  Genitourinary:  Positive for urgency. Negative for dysuria, frequency and hematuria.       Breast ROS: negative   Musculoskeletal:  Negative for back pain, joint pain and myalgias.  Skin:  Negative for itching and rash.  Neurological:  Negative for dizziness, tingling, focal weakness and headaches.  Endo/Heme/Allergies:  Negative for environmental allergies. Does not bruise/bleed easily.  Psychiatric/Behavioral:  Negative for depression and suicidal ideas. The patient is not nervous/anxious and does not have insomnia.     Objective: There were no vitals taken for this visit.   Physical Exam Constitutional:      Appearance: She is well-developed.  Genitourinary:     Vulva normal.     Right Labia: No rash, tenderness or lesions.    Left Labia: No tenderness, lesions or rash.    No vaginal discharge, erythema or tenderness.  Right Adnexa: not tender and no mass present.    Left Adnexa: not tender and no mass present.    No cervical motion tenderness, friability or polyp.     Uterus is not enlarged or tender.  Breasts:    Right: No mass, nipple discharge, skin change or tenderness.     Left: No mass, nipple discharge, skin change or tenderness.  Neck:     Thyroid: No thyromegaly.  Cardiovascular:     Rate and Rhythm: Normal rate and regular rhythm.     Heart sounds: Normal heart sounds. No murmur heard. Pulmonary:     Effort: Pulmonary effort is normal.     Breath sounds: Normal breath sounds.  Abdominal:     Palpations: Abdomen is soft.     Tenderness: There is no abdominal tenderness. There  is no right CVA tenderness, left CVA tenderness, guarding or rebound.  Musculoskeletal:        General: Normal range of motion.     Cervical back: Normal range of motion.  Lymphadenopathy:     Cervical: No cervical adenopathy.  Neurological:     General: No focal deficit present.     Mental Status: She is alert and oriented to person, place, and time.     Cranial Nerves: No cranial nerve deficit.  Skin:    General: Skin is warm and dry.     Findings: No erythema or rash.  Psychiatric:        Mood and Affect: Mood normal.        Behavior: Behavior normal.        Thought Content: Thought content normal.        Judgment: Judgment normal.  Vitals reviewed.    No results found for this or any previous visit (from the past 24 hour(s)).   Assessment/Plan: Encounter for annual routine gynecological examination  Encounter for screening mammogram for malignant neoplasm of breast - Plan: MM 3D SCREEN BREAST BILATERAL; pt to schedule annual  Right flank pain - Plan: CT RENAL STONE STUDY, POCT Urinalysis Dipstick, Comprehensive metabolic panel; trace blood on UA; check CMP and renal CT scan. Will f/u with results.   Urinary urgency - Plan: Meth-Hyo-M Bl-Na Phos-Ph Sal (URIBEL) 118 MG CAPS; Rx uribel prn urgency sx with pain. D/C all caffeine.   No orders of the defined types were placed in this encounter.            GYN counsel adequate intake of calcium and vitamin D     F/U  No follow-ups on file.  Wendy Mccoy B. Ariyannah Pauling, PA-C 02/17/2023 7:54 PM

## 2023-02-18 ENCOUNTER — Other Ambulatory Visit (HOSPITAL_COMMUNITY)
Admission: RE | Admit: 2023-02-18 | Discharge: 2023-02-18 | Disposition: A | Discharge status: Home / Self Care | Payer: No Typology Code available for payment source | Source: Ambulatory Visit | Attending: Obstetrics and Gynecology | Admitting: Obstetrics and Gynecology

## 2023-02-18 ENCOUNTER — Ambulatory Visit (INDEPENDENT_AMBULATORY_CARE_PROVIDER_SITE_OTHER): Payer: No Typology Code available for payment source | Admitting: Obstetrics and Gynecology

## 2023-02-18 ENCOUNTER — Encounter: Payer: Self-pay | Admitting: Obstetrics and Gynecology

## 2023-02-18 VITALS — BP 102/70 | Ht 63.0 in | Wt 133.0 lb

## 2023-02-18 DIAGNOSIS — Z1151 Encounter for screening for human papillomavirus (HPV): Secondary | ICD-10-CM | POA: Diagnosis present

## 2023-02-18 DIAGNOSIS — Z01419 Encounter for gynecological examination (general) (routine) without abnormal findings: Secondary | ICD-10-CM

## 2023-02-18 DIAGNOSIS — Z1231 Encounter for screening mammogram for malignant neoplasm of breast: Secondary | ICD-10-CM

## 2023-02-18 DIAGNOSIS — Z124 Encounter for screening for malignant neoplasm of cervix: Secondary | ICD-10-CM

## 2023-02-18 NOTE — Patient Instructions (Addendum)
I value your feedback and you entrusting us with your care. If you get a [facility] patient survey, I would appreciate you taking the time to let us know about your experience today. Thank you!  [facility] ([place])--[phone]  

## 2023-02-22 LAB — CYTOLOGY - PAP
Comment: NEGATIVE
Diagnosis: NEGATIVE
High risk HPV: NEGATIVE

## 2023-05-22 ENCOUNTER — Ambulatory Visit
Admission: RE | Admit: 2023-05-22 | Discharge: 2023-05-22 | Disposition: A | Discharge status: Home / Self Care | Payer: No Typology Code available for payment source | Source: Ambulatory Visit | Attending: Obstetrics and Gynecology | Admitting: Obstetrics and Gynecology

## 2023-05-22 DIAGNOSIS — Z1231 Encounter for screening mammogram for malignant neoplasm of breast: Secondary | ICD-10-CM | POA: Diagnosis present

## 2024-02-03 IMAGING — CT CT RENAL STONE PROTOCOL
2 of 4 series · 16 of 46 positions shown, 18 images · non-contrast
Comparison: May 11, 2006

CLINICAL DATA: Right flank pain.



[Series 2: stone full standard · axial · 0.70mm/px · z∈[-1145,-750]mm · 13 of 87 slices shown, 15 images]
[im 4/87  soft-tissue]
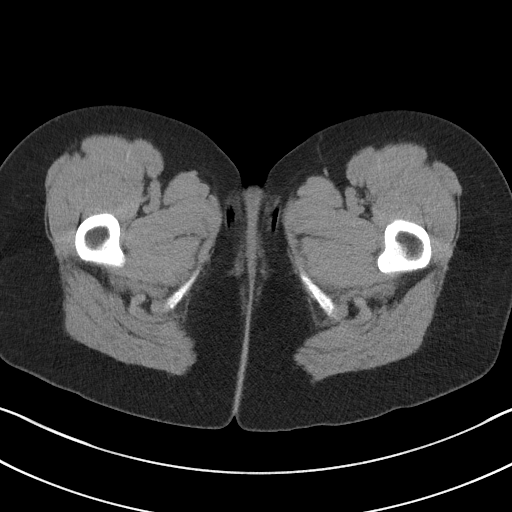
[im 4/87  bone]
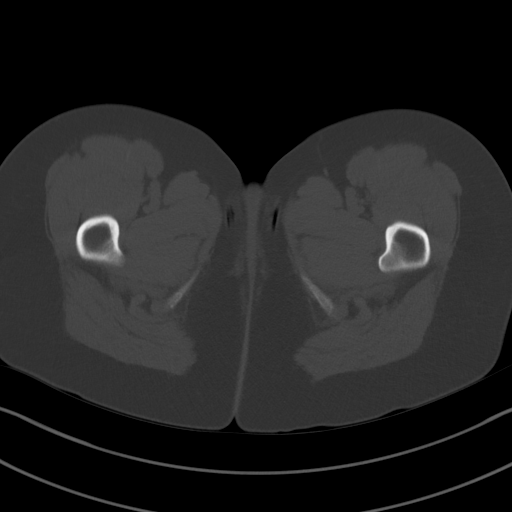
[im 12/87  soft-tissue]
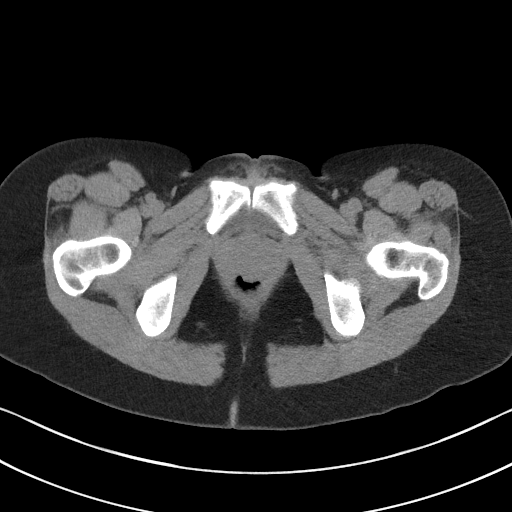
[im 20/87  soft-tissue]
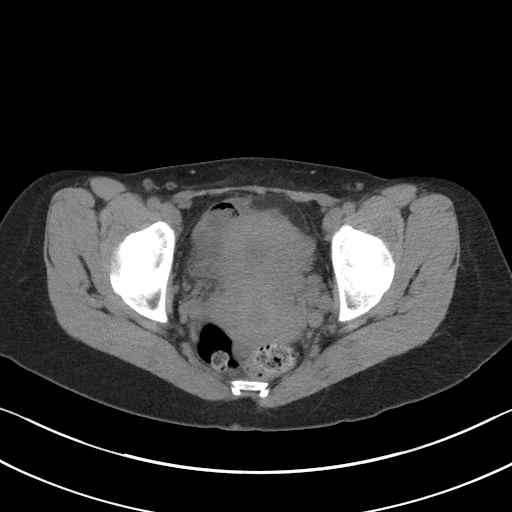
[im 24/87  soft-tissue]
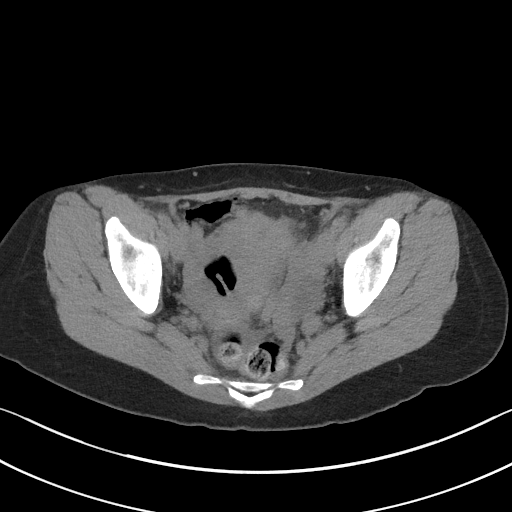
[im 32/87  soft-tissue]
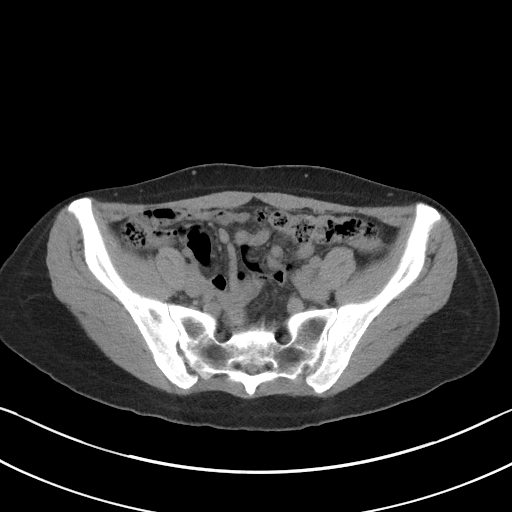
[im 36/87  soft-tissue]
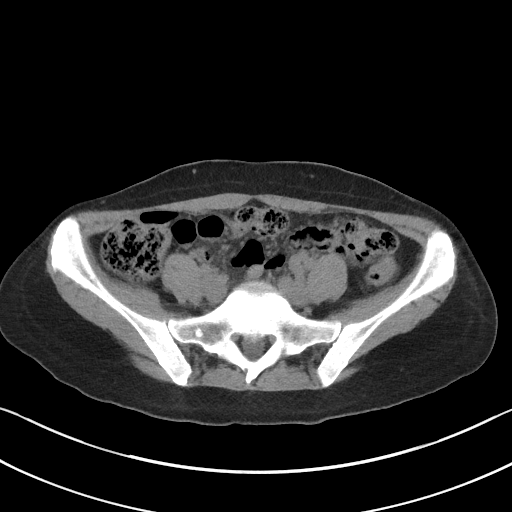
[im 44/87  soft-tissue]
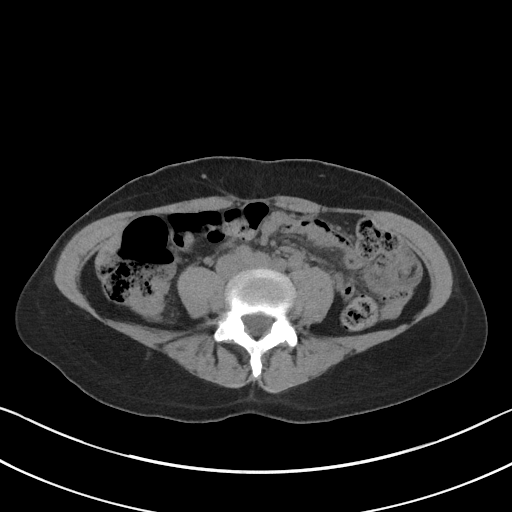
[im 51/87  soft-tissue]
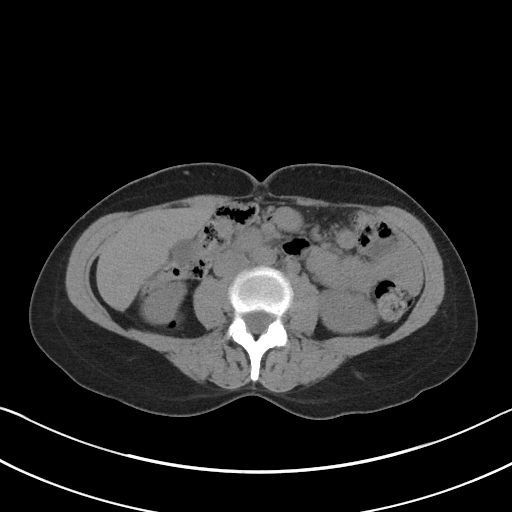
[im 55/87  soft-tissue]
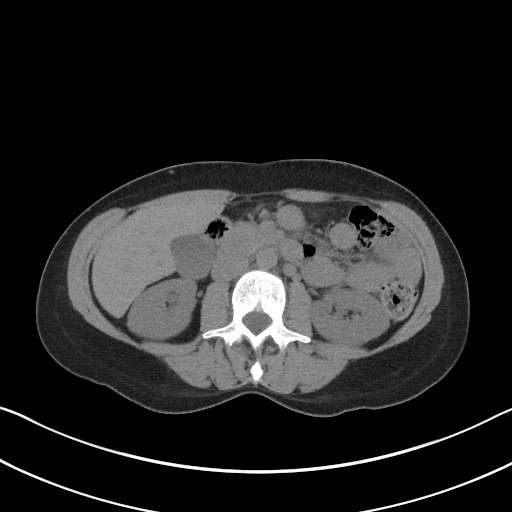
[im 55/87  bone]
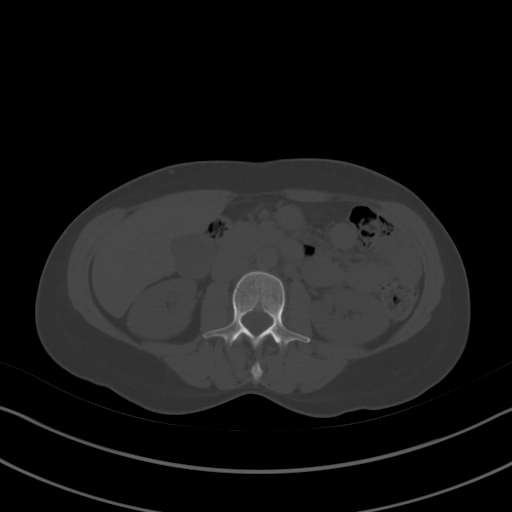
[im 63/87  soft-tissue]
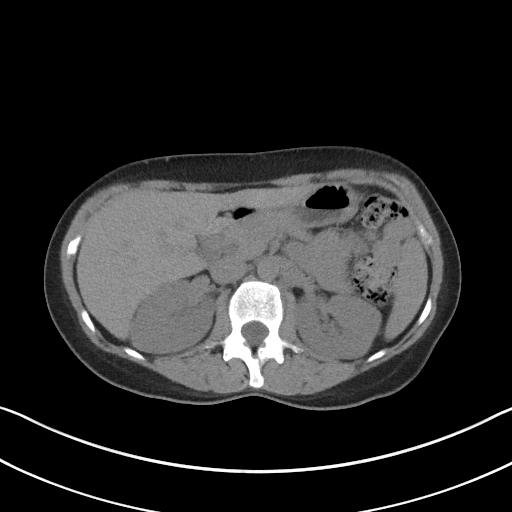
[im 67/87  soft-tissue]
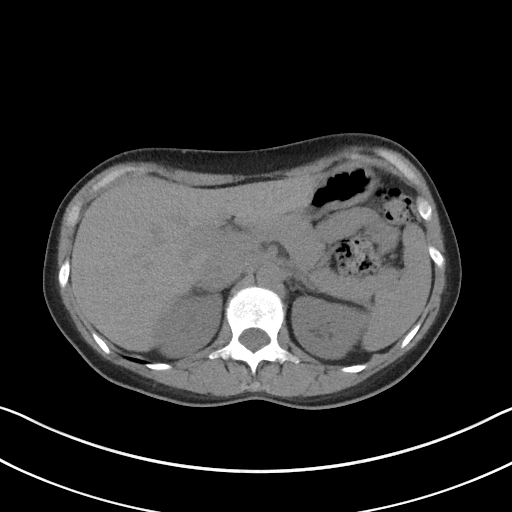
[im 75/87  soft-tissue]
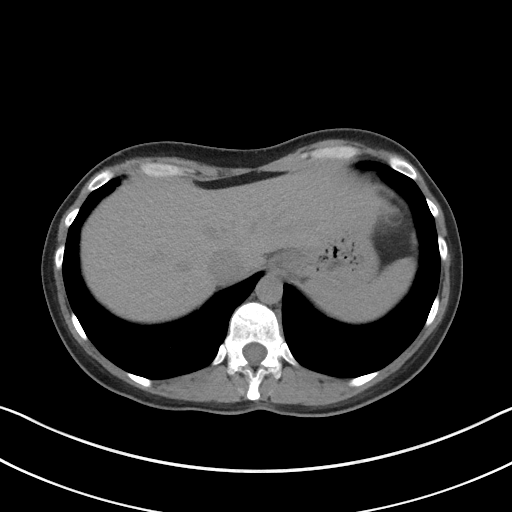
[im 83/87  soft-tissue]
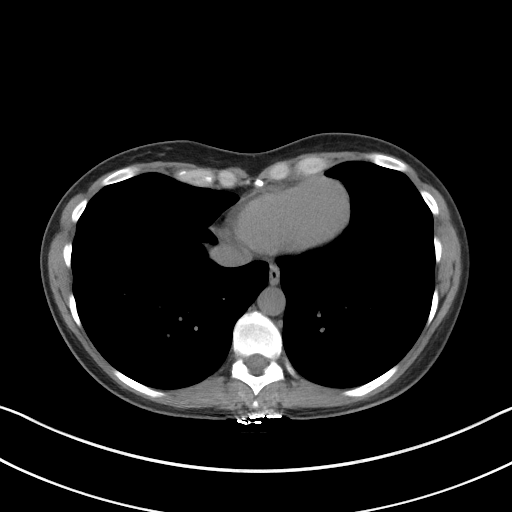

[Series 5: coronal · coronal · 0.67mm/px · 3 of 110 slices shown]
[im 37/110  soft-tissue]
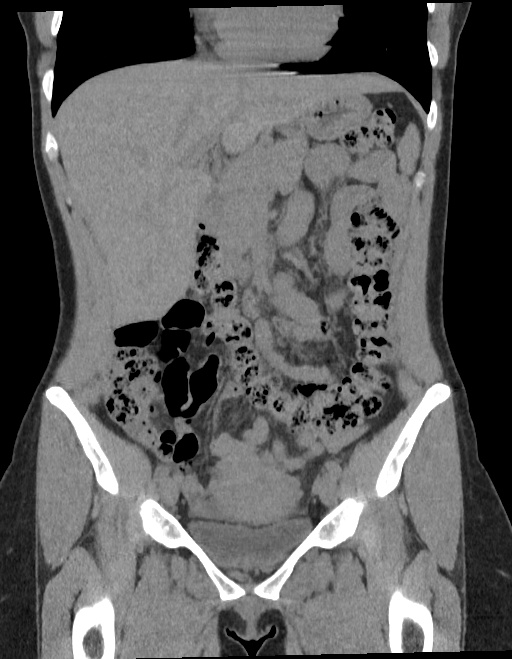
[im 49/110  soft-tissue]
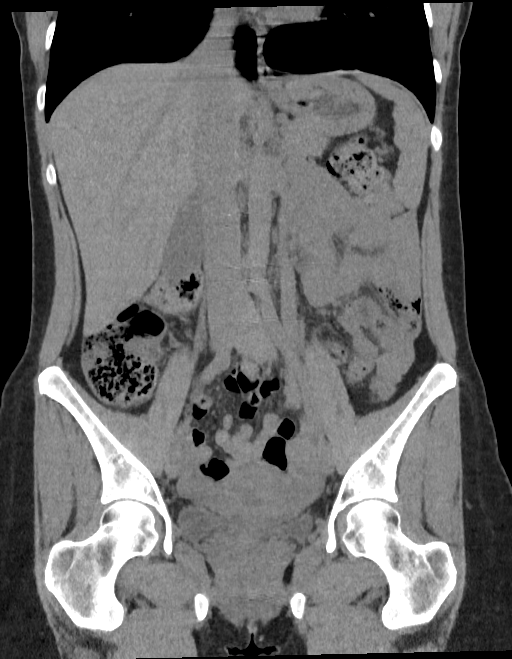
[im 61/110  soft-tissue]
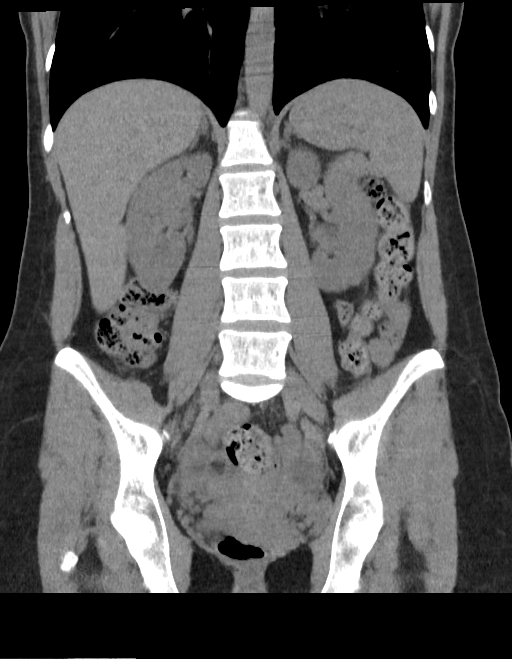

[16 of 46 positions shown; findings below may reference images not displayed]

FINDINGS: Lower chest: No acute abnormality.

Hepatobiliary: No focal liver abnormality is seen. No gallstones,
gallbladder wall thickening, or biliary dilatation.

Pancreas: Unremarkable. No pancreatic ductal dilatation or
surrounding inflammatory changes.

Spleen: Normal in size without focal abnormality.

Adrenals/Urinary Tract: 2 mm nonobstructing stone is identified in
the midpole right kidney. There is a 4 mm stone in the distal right
ureter near the right ureteral vesicular junction without
significant right hydronephrosis. The left kidney is normal. The
bilateral adrenal glands are normal. The bladder is normal.

Stomach/Bowel: Stomach is within normal limits. The appendix is not
seen but no inflammation is noted around cecum. No evidence of bowel
wall thickening, distention, or inflammatory changes.

Vascular/Lymphatic: No significant vascular findings are present. No
enlarged abdominal or pelvic lymph nodes.

Reproductive: Uterus and bilateral adnexa are unremarkable.

Other: None.

Musculoskeletal: Minimal degenerative joint changes at L5-S1 are
noted.
IMPRESSION: 4 mm stone in the distal right ureter near the right ureteral
vesicular junction without significant right hydronephrosis.

## 2024-04-22 NOTE — Progress Notes (Unsigned)
 No chief complaint on file.   HPI:      [name] is a 43 y.o. No obstetric history on file. who LMP was No LMP recorded., presents today for her annual examination.  Her menses are regular every 28-30 days, lasting 7 day, no BTB. Dysmenorrhea not usually but did have mild sx this past menses.   She is single partner, contraception - vasectomy. No pain/bleeding.  Last Pap: 02/18/23 Results were: no abnormalities /neg HPV DNA Hx of STDs: none  Mammogram: 05/22/23 Results were normal, repeat in 12 months; hx of cat 4 LT breast with neg bx and PASH on bx 2023 There is a FH of breast cancer in her mom, genetic testing not indicated for pt. There is no FH of ovarian cancer. The patient does do self-breast exams.  Tobacco use: The patient denies current or previous tobacco use. Alcohol use: none  No drug use Exercise: mod active  She does get adequate calcium and Vitamin D in her diet.  Hx of kidney stones.   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  HENT:  Negative for congestion, ear pain and sinus pain.   Respiratory:  Negative for cough, shortness of breath and wheezing.   Cardiovascular:  Negative for chest pain, orthopnea and leg swelling.  Gastrointestinal:  Negative for constipation, diarrhea, nausea and vomiting.  Genitourinary:  Negative for dysuria, frequency, hematuria and urgency.       Breast ROS: negative    Musculoskeletal:  Negative for back pain, joint pain and myalgias.  Skin:  Negative for itching and rash.  Neurological:  Negative for dizziness, tingling, focal weakness and headaches.  Endo/Heme/Allergies:  Negative for environmental allergies. Does not bruise/bleed easily.  Psychiatric/Behavioral:  Negative for depression and suicidal ideas. The patient is not nervous/anxious and does not have insomnia.     Objective: There were no vitals taken for this visit.   Physical Exam Constitutional:      Appearance: She is well-developed.  Genitourinary:     Vulva normal.     Right Labia: No rash, tenderness or lesions.    Left Labia: No tenderness, lesions or rash.    No vaginal discharge, erythema or tenderness.      Right Adnexa: not tender and no mass present.    Left Adnexa: not tender and no mass present.    No cervical motion tenderness, friability or polyp.     Uterus is not enlarged or tender.  Breasts:    Right: No mass, nipple discharge, skin change or tenderness.     Left: No mass, nipple discharge, skin change or tenderness.  Neck:     Thyroid: No thyromegaly.  Cardiovascular:     Rate and Rhythm: Normal rate and regular rhythm.  Heart sounds: Normal heart sounds. No murmur heard. Pulmonary:     Effort: Pulmonary effort is normal.     Breath sounds: Normal breath sounds.  Abdominal:     Palpations: Abdomen is soft.     Tenderness: There is no abdominal tenderness. There is no right CVA tenderness, left CVA tenderness, guarding or rebound.  Musculoskeletal:        General: Normal range of motion.     Cervical back: Normal range of motion.  Lymphadenopathy:     Cervical: No cervical adenopathy.  Neurological:     General: No focal deficit present.     Mental Status: She is alert and oriented to person, place, and time.     Cranial Nerves: No cranial nerve deficit.  Skin:    General: Skin is warm and dry.     Findings: No erythema or rash.  Psychiatric:         Mood and Affect: Mood normal.        Behavior: Behavior normal.        Thought Content: Thought content normal.        Judgment: Judgment normal.  Vitals reviewed.     Assessment/Plan: Encounter for annual routine gynecological examination  Cervical cancer screening - Plan: Cytology - PAP  Screening for HPV (human papillomavirus) - Plan: Cytology - PAP  Encounter for screening mammogram for malignant neoplasm of breast - Plan: MM 3D SCREENING MAMMOGRAM BILATERAL BREAST; pt to schedule mammo            GYN counsel adequate intake of calcium and vitamin D     F/U  No follow-ups on file.  Wendy Mccoy B. Wendy Bonaparte, PA-C 04/22/2024 2:34 PM

## 2024-04-23 ENCOUNTER — Encounter: Payer: Self-pay | Admitting: Obstetrics and Gynecology

## 2024-04-23 ENCOUNTER — Ambulatory Visit (INDEPENDENT_AMBULATORY_CARE_PROVIDER_SITE_OTHER): Admitting: Obstetrics and Gynecology

## 2024-04-23 VITALS — BP 107/72 | HR 74 | Ht 63.0 in | Wt 141.0 lb

## 2024-04-23 DIAGNOSIS — Z1231 Encounter for screening mammogram for malignant neoplasm of breast: Secondary | ICD-10-CM

## 2024-04-23 DIAGNOSIS — Z01411 Encounter for gynecological examination (general) (routine) with abnormal findings: Secondary | ICD-10-CM | POA: Diagnosis not present

## 2024-04-23 DIAGNOSIS — Z01419 Encounter for gynecological examination (general) (routine) without abnormal findings: Secondary | ICD-10-CM

## 2024-04-23 DIAGNOSIS — N6321 Unspecified lump in the left breast, upper outer quadrant: Secondary | ICD-10-CM

## 2024-04-23 NOTE — Patient Instructions (Addendum)
 I value your feedback and you entrusting [place] with your care. If you get a [facility] patient survey, I would appreciate you taking the time to let us know about your experience today. Thank you!  [facility] ([place])--[phone]

## 2024-04-29 ENCOUNTER — Other Ambulatory Visit

## 2024-04-29 ENCOUNTER — Encounter

## 2024-05-06 ENCOUNTER — Ambulatory Visit
Admission: RE | Admit: 2024-05-06 | Discharge: 2024-05-06 | Disposition: A | Discharge status: Home / Self Care | Source: Ambulatory Visit | Attending: Obstetrics and Gynecology | Admitting: Obstetrics and Gynecology

## 2024-05-06 DIAGNOSIS — Z1231 Encounter for screening mammogram for malignant neoplasm of breast: Secondary | ICD-10-CM

## 2024-05-06 DIAGNOSIS — N6321 Unspecified lump in the left breast, upper outer quadrant: Secondary | ICD-10-CM | POA: Diagnosis present

## 2024-05-07 ENCOUNTER — Ambulatory Visit: Payer: Self-pay | Admitting: Obstetrics and Gynecology
